# Patient Record
Sex: Female | Born: 1995 | Race: White | Hispanic: No | Smoking: Never smoker
Health system: Southern US, Community
[De-identification: ages and names within clinical notes are randomized; demographics above are authoritative.]

## PROBLEM LIST (undated history)

## (undated) HISTORY — PX: WISDOM TOOTH EXTRACTION: SHX21

---
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
## 2017-05-25 ENCOUNTER — Encounter (HOSPITAL_COMMUNITY): Payer: Self-pay | Admitting: Emergency Medicine

## 2017-05-25 ENCOUNTER — Other Ambulatory Visit: Payer: Self-pay

## 2017-05-25 ENCOUNTER — Emergency Department (HOSPITAL_COMMUNITY): Payer: BLUE CROSS/BLUE SHIELD

## 2017-05-25 ENCOUNTER — Observation Stay (HOSPITAL_COMMUNITY)
Admission: EM | Admit: 2017-05-25 | Discharge: 2017-05-26 | Disposition: A | Discharge status: Home / Self Care | Payer: BLUE CROSS/BLUE SHIELD | Attending: Pediatrics | Admitting: Pediatrics

## 2017-05-25 DIAGNOSIS — R109 Unspecified abdominal pain: Secondary | ICD-10-CM | POA: Diagnosis not present

## 2017-05-25 DIAGNOSIS — R748 Abnormal levels of other serum enzymes: Secondary | ICD-10-CM | POA: Diagnosis not present

## 2017-05-25 DIAGNOSIS — B2799 Infectious mononucleosis, unspecified with other complication: Secondary | ICD-10-CM

## 2017-05-25 DIAGNOSIS — R17 Unspecified jaundice: Secondary | ICD-10-CM | POA: Diagnosis not present

## 2017-05-25 DIAGNOSIS — B349 Viral infection, unspecified: Secondary | ICD-10-CM

## 2017-05-25 DIAGNOSIS — R509 Fever, unspecified: Secondary | ICD-10-CM | POA: Diagnosis present

## 2017-05-25 DIAGNOSIS — R112 Nausea with vomiting, unspecified: Secondary | ICD-10-CM | POA: Insufficient documentation

## 2017-05-25 DIAGNOSIS — B178 Other specified acute viral hepatitis: Secondary | ICD-10-CM | POA: Diagnosis not present

## 2017-05-25 LAB — CBC
HCT: 35.9 % — ABNORMAL LOW (ref 36.0–46.0)
Hemoglobin: 12.1 g/dL (ref 12.0–15.0)
MCH: 28.4 pg (ref 26.0–34.0)
MCHC: 33.7 g/dL (ref 30.0–36.0)
MCV: 84.3 fL (ref 78.0–100.0)
Platelets: 177 10*3/uL (ref 150–400)
RBC: 4.26 MIL/uL (ref 3.87–5.11)
RDW: 13.6 % (ref 11.5–15.5)
WBC: 8.3 10*3/uL (ref 4.0–10.5)

## 2017-05-25 LAB — COMPREHENSIVE METABOLIC PANEL
ALT: 207 U/L — ABNORMAL HIGH (ref 14–54)
AST: 135 U/L — ABNORMAL HIGH (ref 15–41)
Albumin: 3.5 g/dL (ref 3.5–5.0)
Alkaline Phosphatase: 233 U/L — ABNORMAL HIGH (ref 38–126)
Anion gap: 8 (ref 5–15)
BUN: <5 mg/dL — ABNORMAL LOW (ref 6–20)
CO2: 25 mmol/L (ref 22–32)
Calcium: 8.6 mg/dL — ABNORMAL LOW (ref 8.9–10.3)
Chloride: 104 mmol/L (ref 101–111)
Creatinine, Ser: 0.65 mg/dL (ref 0.44–1.00)
GFR calc Af Amer: >60 mL/min (ref >60)
GFR calc non Af Amer: >60 mL/min (ref >60)
Glucose, Bld: 93 mg/dL (ref 65–99)
Potassium: 3.5 mmol/L (ref 3.5–5.1)
Sodium: 137 mmol/L (ref 135–145)
Total Bilirubin: 3.6 mg/dL — ABNORMAL HIGH (ref 0.3–1.2)
Total Protein: 6.8 g/dL (ref 6.5–8.1)

## 2017-05-25 LAB — URINALYSIS, ROUTINE W REFLEX MICROSCOPIC
Glucose, UA: NEGATIVE mg/dL
Hgb urine dipstick: NEGATIVE
Ketones, ur: 5 mg/dL — AB
Leukocytes, UA: NEGATIVE
Nitrite: NEGATIVE
Protein, ur: NEGATIVE mg/dL
Specific Gravity, Urine: 1.011 (ref 1.005–1.030)
pH: 7 (ref 5.0–8.0)

## 2017-05-25 LAB — LIPASE, BLOOD: Lipase: 25 U/L (ref 11–51)

## 2017-05-25 LAB — POC URINE PREG, ED: Preg Test, Ur: NEGATIVE

## 2017-05-25 MED ORDER — ONDANSETRON HCL 4 MG/2ML IJ SOLN
4.0000 mg | Freq: Once | INTRAMUSCULAR | Status: AC
  Administered 2017-05-25: 4 mg via INTRAVENOUS
  Filled 2017-05-25: qty 2

## 2017-05-25 MED ORDER — ONDANSETRON HCL 4 MG/2ML IJ SOLN: 4.0000 mg | Freq: Four times a day (QID) | INTRAMUSCULAR | Status: DC | PRN

## 2017-05-25 MED ORDER — SODIUM CHLORIDE 0.9 % IV BOLUS (SEPSIS)
1000.0000 mL | Freq: Once | INTRAVENOUS | Status: AC
  Administered 2017-05-25: 1000 mL via INTRAVENOUS

## 2017-05-25 MED ORDER — DEXTROSE-NACL 5-0.9 % IV SOLN
INTRAVENOUS | Status: DC
  Administered 2017-05-26: via INTRAVENOUS

## 2017-05-25 MED ORDER — NORGESTIMATE-ETH ESTRADIOL 0.25-35 MG-MCG PO TABS: 1.0000 | ORAL_TABLET | Freq: Every day | ORAL | Status: DC

## 2017-05-25 NOTE — ED Triage Notes (Signed)
Patient presents with multiple complaints : fever , chills , emesis , generalized body aches , sore throat , malaise and headache onset last week , seen by her school MD /  PCP today with abnormal blood tests ( elevated bilirubin) results , advised to go to ER for possible admission . Consulted Corrie Mckusick. Mohr PA at triage on pt.'s condition .

## 2017-05-25 NOTE — ED Provider Notes (Signed)
Emergency Department Provider Note   I have reviewed the triage vital signs and the nursing notes.   HISTORY  Chief Complaint Fever; Chills; and Emesis   HPI Hailey Torres is a 21 y.o. female with no significant PMH presents to the emergency department for evaluation of flulike symptoms over the past 9 days with intermittent fevers along with nausea and vomiting which started today.  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  Her bilirubin came back elevated and she was referred to the emergency department for ultrasound.  Patient has not had fever today but states that her nausea and vomiting are new.   History reviewed. No pertinent past medical history.  There are no active problems to display for this patient.   Past Surgical History:  Procedure Laterality Date  . WISDOM TOOTH EXTRACTION      Current Outpatient Rx  . Order #: [id number] Class: Historical Med  . Order #: [id number] Class: Historical Med  . Order #: [id number] Class: Historical Med  . Order #: [id number] Class: Historical Med    Allergies Tape  No family history on file.  Social History Social History   Tobacco Use  . Smoking status: Never Smoker  . Smokeless tobacco: Never Used  Substance Use Topics  . Alcohol use: Yes    Frequency: Never  . Drug use: No    Review of Systems  Constitutional: Positive fever/chills Eyes: No visual changes. ENT: No sore throat. Cardiovascular: Denies chest pain. Respiratory: Denies shortness of breath. Gastrointestinal: No abdominal pain. Positive nausea and vomiting. No diarrhea. No constipation. Genitourinary: Negative for dysuria. Musculoskeletal: Negative for back pain. Skin: Negative for rash. Neurological: Negative for headaches, focal weakness or numbness.  10-point ROS otherwise negative.  ____________________________________________   PHYSICAL  EXAM:  VITAL SIGNS: ED Triage Vitals [05/25/17 2027]  Enc Vitals Group     BP 130/89     Pulse Rate (!) 116     Resp 18     Temp 99.4 F (37.4 C)     Temp Source Oral     SpO2 100 %    Constitutional: Alert and oriented. Well appearing and in no acute distress. Eyes: Conjunctivae are normal. Icteric sclera.  Head: Atraumatic. Nose: No congestion/rhinnorhea. Mouth/Throat: Mucous membranes are moist.  Neck: No stridor. Cardiovascular: Normal rate, regular rhythm. Good peripheral circulation. Grossly normal heart sounds.   Respiratory: Normal respiratory effort.  No retractions. Lungs CTAB. Gastrointestinal: Soft and nontender. No distention.  Musculoskeletal: No lower extremity tenderness nor edema. No gross deformities of extremities. Neurologic:  Normal speech and language. No gross focal neurologic deficits are appreciated.  Skin:  Skin is warm, dry and intact. No rash noted.  ____________________________________________   LABS (all labs ordered are listed, but only abnormal results are displayed)  Labs Reviewed  CBC - Abnormal; Notable for the following components:      Result Value   HCT 35.9 (*)    All other components within normal limits  COMPREHENSIVE METABOLIC PANEL - Abnormal; Notable for the following components:   BUN <5 (*)    Calcium 8.6 (*)    AST 135 (*)    ALT 207 (*)    Alkaline Phosphatase 233 (*)    Total Bilirubin 3.6 (*)    All other components within normal limits  URINALYSIS, ROUTINE W REFLEX MICROSCOPIC - Abnormal; Notable for the following components:   Color, Urine AMBER (*)  Bilirubin Urine SMALL (*)    Ketones, ur 5 (*)    All other components within normal limits  LIPASE, BLOOD  POC URINE PREG, ED   ____________________________________________  RADIOLOGY  [place] Abdomen Limited Ruq  Result Date: 05/25/2017 CLINICAL DATA:  Abdominal pain. EXAM: ULTRASOUND ABDOMEN LIMITED RIGHT UPPER QUADRANT COMPARISON:  None. FINDINGS:  Gallbladder: Contracted, patient was not NPO prior to the exam. Normal gallbladder wall thickness for degree of distension. No gallstones or pericholecystic inflammation. No sonographic Murphy sign noted by sonographer. Common bile duct: Diameter: 3 mm, normal. Liver: No focal lesion identified. Within normal limits in parenchymal echogenicity. Portal vein is patent on color Doppler imaging with normal direction of blood flow towards the liver. IMPRESSION: 1. Contracted gallbladder likely due to recent p.o. intake. No gallstones or sonographic findings of acute cholecystitis. 2. Normal sonographic appearance of the liver and biliary tree. Electronically Signed   By: Rubye OaksMelanie  Ehinger M.D.   On: 05/25/2017 21:54    ____________________________________________   PROCEDURES  Procedure(s) performed:   Procedures  None ____________________________________________   INITIAL IMPRESSION / ASSESSMENT AND PLAN / ED COURSE  Pertinent labs & imaging results that were available during my care of the patient were reviewed by me and considered in my medical decision making (see chart for details).  Patient presents to the ED with flu-like symptoms and elevated bilirubin and AST/ALT. US from triage is negative for bile duct dilation. No fever or evidence of cholangitis. Plan to speak with GI to discuss treatment and further testing.   10:14 PM Spoke with Dr. Levora AngelBrahmbhatt with GI who will consult in the AM. Recommends MRCP but notes he has seen cases recently of Mono causing similar symptoms. Will treat symptoms and as hospitalist for admission.   Discussed patient's case with Pediatric Service to request admission. Patient and family (if present) updated with plan. [facility]  I reviewed all nursing notes, vitals, pertinent old records, EKGs, labs, imaging (as available).  ____________________________________________  FINAL CLINICAL IMPRESSION(S) / ED DIAGNOSES  Final diagnoses:   Abdominal pain     MEDICATIONS GIVEN DURING THIS VISIT:  Medications  ondansetron (ZOFRAN) injection 4 mg (not administered)  sodium chloride 0.9 % bolus 1,000 mL (1,000 mLs Intravenous New Bag/Given 05/25/17 2209)    Note:  This document was prepared using Dragon voice recognition software and may include unintentional dictation errors.  Alona BeneJoshua Deeya Richeson, MD Emergency Medicine    Shivaun Bilello, Arlyss RepressJoshua G, MD 05/25/17 2227

## 2017-05-25 NOTE — ED Provider Notes (Signed)
MSE was initiated and I personally evaluated the patient and placed orders (if any) at  8:39 PM on May 25, 2017.  The patient appears stable so that the remainder of the MSE may be completed by another provider.  Called over from Triage due to complex patient   21 y.o. female, presents to the Emergency Department today from PCP due to elevated bilirubin levels. Unknown level as pt without paperwork or labs not accessible at this time on computer. Notes URI symptoms x several days. [facility]. Evaluated for Mono, which was negative. Pt went for follow up appointment today with PCP and was called with results of elevated bilirubin. Pt notes mild abdominal pain with PO intake earlier today. This caused her to have episode of emesis. Bilious without hematemesis. Notes low grade fever. No CP/SOB. No cough/congestion. No hx abdominal surgeries. Normal BMS. Pt otherwise stable  Physical Exam  Constitutional: She is oriented to person, place, and time and well-developed, well-nourished, and in no distress. No distress.  HENT:  Head: Normocephalic and atraumatic.  Eyes: Conjunctivae and EOM are normal. Pupils are equal, round, and reactive to light.  No scleral icterus   Neck: Normal range of motion. Neck supple.  Cardiovascular: Normal rate, regular rhythm and normal heart sounds.  Pulmonary/Chest: Breath sounds normal. She is in respiratory distress. She has no wheezes. She has no rales. She exhibits no tenderness.  Abdominal: Soft. Normal appearance and bowel sounds are normal. There is no hepatosplenomegaly. There is tenderness in the right upper quadrant. There is no rigidity, no rebound, no guarding, no CVA tenderness, no tenderness at McBurney's point and negative Murphy's sign.  Musculoskeletal: Normal range of motion.  Neurological: She is alert and oriented to person, place, and time.  Skin: Skin is warm and dry. She is not diaphoretic.  No evidence of  jaundice  Nursing note and vitals reviewed.  Pt with sudden onset abdominal pain emesis after PO intake. Suspect either cholecystitis, but doubt given exam. Potential for choledocholithiasis. There is low grade temp with abdominal discomfort. That with elevated bilirubin could be likely. Will order CBC/CMP/Lipase for further evaluation. RUQ US placed to eval CBD. If unremarkable, a viral hepatitis could be possible as well.  Pt stable at this time.        Audry PiliMohr, Briauna Gilmartin, PA-C 05/25/17 2110    Maia PlanLong, Joshua G, MD 05/25/17 2233

## 2017-05-25 NOTE — H&P (Signed)
Pediatric Teaching Program H&P [address]  [place], [place] [address] Phone: [phone] Fax: [phone]   Patient Details  Name: Hailey Torres MRN: [id number] DOB: 23-Feb-1996 Age: 21 y.o.          Gender: female   Chief Complaint  Elevated liver enzymes and Jaundice  History of the Present Illness  Hailey Torres is a previously healthy 21 year old female who presents with elevated liver enzymes, in the setting of 9 days of fatigue, intermittent fevers, decreased appetite, sore throat, and 1 day of nausea/vomiting  Patient states that she began feeling ill with symptoms of fatigue, decreased appetite, general body aches, and off and on fevers  approx 9 days ago. 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 The results of monospot test returned negative. However, patient continued with symptoms.   Patient eventually visited her PCP today who repeated blood work. At the time, liver enzymes were found to be abnormal with elevations as follows (AST - 125, ALT- 209, Total bili 1.7 (Direct of 1.6)). Because there was concern for infectious etiology, PCP tested for an EBV and CMV (results PENDING). Request was made by PCP for patient to be admitted for monitoring of liver function given acute abnormalities.  This afternoon, patient reports having NBNB emesis x 2 with some abdominal pain. No complaints of fever at this time. Was    Patient arrived to the ED where she was noted to be tachycardic to 116, with scleral icterus and mild jaundice but otherwise was afebrile with all other vitals within normal limits. CBC, CMP, lipase, UA, Upreg were drawn.  AlkPhos, AST, ALT were mildly elevated to 233, 135, and 207 respectively). UA was notably amber with small amount of bilirubin. All other labs were grossly normal. An Abdominal ultrasound was performed given concern for  stone or other obstruction. Results showed no stones, acute cholecystitis or obstruction to the biliary tree. Patient was started on IVF and given 1 dose of zofran for complaints of nausea.   Transferring to floor for continued monitoring of liver enzymes and for IVFs.    Review of Systems  Endorses fatigue, fever, sore throat, yellow eyes, decreased appetite, vomiting, lymphadenopathy  Denies chills, vision changes, rashes, parasthesias, itching, diarrhea, constipation, changes in mental status/behavior   Patient Active Problem List  Active Problems:   Jaundice   Past Birth, Medical & Surgical History  Past Birth Hx - Jaundiced as a neonate, but otherwise normal PMHx - None, though per patient, has been ill off and on since starting college  PSHx - Wisdom teeth removed  Developmental History  Normal (patient above and beyond per mother)  Diet History  Normal diet  Family History  PGF - gall bladder removed Mother - Hx of mono with splenomegaly, elevated liver enzymes, and jaundice  Social History  In college freshman in the dorm Social drinker - 1 to 2 beers every month Denies recreational drug use (cocaine, marijuana, heroine, PCP) Denies tobacco  Not sexually active, denies possibility of pregnancy Denies Depression, SI, SA  Primary Care Provider  [facility] Vaughan [facility]astaSummer, [facility]  Home Medications  Medication     Dose Birth Control 1 tab qD   Cetirizine 10mg  qD  Flonase 2 sprays both nares q D  Ibuprofen 200 -400mg  q 6hrs PRN      Allergies   Allergies  Allergen Reactions  . Tape Other (See Comments)  Band-aids ("Nexcare") made the patient's skin feel like it had been "burned"     Immunizations  UTD  Exam  BP 130/89 (BP Location: Right Arm)   Pulse (!) 116   Temp 99.4 F (37.4 C) (Oral)   Resp 18   LMP 05/19/2017   SpO2 100%   Weight:     No weight on file for this encounter.  General: Well nourished, well appearing female, alert and  oriented to person, place, time and situation, NAD HEENT: scleral icterus,  EOMI, Pupils equal and reactive to light, no erythema or exudate in oropharynx Neck: [place], full ROM Lymph nodes: Mildly tender anterior cervical and submandibular lymphadenopathy Chest: CTAB, no crackles, wheezing, rhonchi, normal work of breathing, no retractions or nasal flaring Heart: RRR, no m/g/r, normal S1 and S2, cap refil <3secs Abdomen: Soft, NT, ND, (+) Bowel sounds in 4 quadrants, no guarding, no splenomegaly, no hepatomegaly Genitalia: Deferred Extremities: Warm and well perfused, 2+ distal pulses in b/l upper and lower extremities Musculoskeletal: Full ROM of b/l upper and lower extremities, good strength  Neurological: CN 2-12 grossly intact,  Skin: Freckles, no jaundice, no rash, no   Selected Labs & Studies  U/S - normal, no eveidence of choledoicholitiasis Coags - (PTT, INR) to assess liver function F/U  EBV, CMV, from outpatient pediatricians office Hepatitis panel Hepatic Function in AM to trend AST, ALT, Bilirubin and  GI consult - potentially for the morning  Assessment  Hailey Torres is a previously healthy 21 year old female who presents with elevated liver enzymes with suspected mononucleosis given prolonged sxs of fatigue, intermittent fevers, decreased appetite, sore throat, and now nausea/vomiting. There is concern for infectious mononucleosis and EBV and CMV are currently pending. Also screening for other infectious causes of hepatitis with a Hep panel. Patient denies any concerning social behaviors such as drug use or sexual activity, however, will screen for HIV and obtaining a tylenol lvl to see if these are potential causes of patient's elevated liver enzymes.   Initially, there was concern for Sullivan LoneGilbert Syndrome, a heritable cause of indirect hyperbilirubinemia (given mother's similar hx of elevated liver enzymes and splenomegaly with mono infection in her teens as well as  patient's hx of jaundice as a neonate) Encouraged that there is no increased indirect bili on initial labs, a feature of Gilbert syndrome. Will monitor and trend Direct and indirect bilirubin. Monitoring coags and repeating Liver enzymes to assess patient's liver function.   Plan  Elevated liver enzymes/Jaundice - F/U EBV and CMV - Coags (PTT, INR) now - HIV now - Tylenol level now - Hepatitis Panel now - Hepatic Function Panel in AM - Peds GI consult  FEN/GI - NPO  - D5NS mIVF - Zofran prn for n/v/ abdominal pain  Endo - Continue birth control  DISPO - Pending improvement in clincal status, improving liver enzymes,     Dekisha Mesmer 05/25/2017, 10:29 PM

## 2017-05-26 ENCOUNTER — Other Ambulatory Visit: Payer: Self-pay

## 2017-05-26 ENCOUNTER — Encounter (HOSPITAL_COMMUNITY): Payer: Self-pay

## 2017-05-26 DIAGNOSIS — R748 Abnormal levels of other serum enzymes: Secondary | ICD-10-CM

## 2017-05-26 DIAGNOSIS — B349 Viral infection, unspecified: Secondary | ICD-10-CM

## 2017-05-26 LAB — PROTIME-INR
INR: 1.06
Prothrombin Time: 13.7 seconds (ref 11.4–15.2)

## 2017-05-26 LAB — APTT: aPTT: 29 seconds (ref 24–36)

## 2017-05-26 LAB — HEPATIC FUNCTION PANEL
ALT: 180 U/L — ABNORMAL HIGH (ref 14–54)
AST: 118 U/L — ABNORMAL HIGH (ref 15–41)
Albumin: 3 g/dL — ABNORMAL LOW (ref 3.5–5.0)
Alkaline Phosphatase: 205 U/L — ABNORMAL HIGH (ref 38–126)
Bilirubin, Direct: 1.6 mg/dL — ABNORMAL HIGH (ref 0.1–0.5)
Indirect Bilirubin: 1.3 mg/dL — ABNORMAL HIGH (ref 0.3–0.9)
Total Bilirubin: 2.9 mg/dL — ABNORMAL HIGH (ref 0.3–1.2)
Total Protein: 5.9 g/dL — ABNORMAL LOW (ref 6.5–8.1)

## 2017-05-26 LAB — ACETAMINOPHEN LEVEL: Acetaminophen (Tylenol), Serum: <10 ug/mL — ABNORMAL LOW (ref 10–30)

## 2017-05-26 LAB — HIV ANTIBODY (ROUTINE TESTING W REFLEX): HIV Screen 4th Generation wRfx: NONREACTIVE

## 2017-05-26 LAB — MONONUCLEOSIS SCREEN: Mono Screen: NEGATIVE

## 2017-05-26 MED ORDER — KETOROLAC TROMETHAMINE 15 MG/ML IJ SOLN
15.0000 mg | Freq: Once | INTRAMUSCULAR | Status: AC
  Administered 2017-05-26: 15 mg via INTRAVENOUS
  Filled 2017-05-26: qty 1

## 2017-05-26 MED ORDER — ONDANSETRON HCL 4 MG PO TABS: 4.0000 mg | ORAL_TABLET | Freq: Three times a day (TID) | ORAL | 0 refills | Status: AC | PRN

## 2017-05-26 NOTE — Consult Note (Signed)
Referring Provider:  EDP [facility] Primary Gastroenterologist: Gentry FitzUnassigned  Reason for Consultation:   Abdominal pain/ elevated LFTs  HPI: Hailey Torres is a 21 y.o. female was advised to come to the ED for possible admission by primary care physician because of elevated LFTs.   Patient seen and examined at bedside. Patient started noticing fatigue and weakness around 1 week ago. She also noted dark urine starting 4 days ago followed by urologist discoloration of her eye. She was followed by numerous to the clinic. Mononucleosis screen was negative. Seen by PCP  yesterday . Blood work  showed elevated LFTs and was advised to come to the ED for further evaluation. Ultrasound was done yesterday which was normal including CBD of 3 mm.  Descending and abdominal pain. Episodes of nausea and vomiting yesterday which is resolved now. Denied diarrhea. Occasional constipation. Denied blood in the stool or black stool. Denied sore throat. No dysphagia or odynophagia  No known family history of colon cancer   History reviewed. No pertinent past medical history.  Past Surgical History:  Procedure Laterality Date  . WISDOM TOOTH EXTRACTION      Prior to Admission medications   Medication Sig Start Date End Date Taking? Authorizing Provider  cetirizine (ZYRTEC) 10 MG tablet Take 10 mg by mouth daily as needed for allergies or rhinitis.   Yes [provider]  fluticasone (FLONASE) 50 MCG/ACT nasal spray Place 2 sprays into both nostrils daily as needed for allergies or rhinitis.   Yes [provider]  ibuprofen (ADVIL,MOTRIN) 200 MG tablet Take 200-400 mg by mouth every 6 (six) hours as needed (for pain or headaches).   Yes [provider]  norgestimate-ethinyl estradiol (ESTARYLLA) 0.25-35 MG-MCG tablet Take 1 tablet by mouth daily.   Yes [provider]    Scheduled Meds: . norgestimate-ethinyl estradiol  1 tablet  Oral Daily   Continuous Infusions: . dextrose 5 % and 0.9% NaCl 100 mL/hr at 05/26/17 0015   PRN Meds:.ondansetron (ZOFRAN) IV  Allergies as of 05/25/2017 - Review Complete 05/25/2017  Allergen Reaction Noted  . Tape Other (See Comments) 05/25/2017    No family history on file.  Social History   Socioeconomic History  . Marital status: Single    Spouse name: Not on file  . Number of children: Not on file  . Years of education: Not on file  . Highest education level: Not on file  Social Needs  . Financial resource strain: Not on file  . Food insecurity - worry: Not on file  . Food insecurity - inability: Not on file  . Transportation needs - medical: Not on file  . Transportation needs - non-medical: Not on file  Occupational History  . Not on file  Tobacco Use  . Smoking status: Never Smoker  . Smokeless tobacco: Never Used  Substance and Sexual Activity  . Alcohol use: Yes    Frequency: Never  . Drug use: No  . Sexual activity: No    Birth control/protection: Pill  Other Topics Concern  . Not on file  Social History Narrative  . Not on file    Review of Systems: Review of Systems  Constitutional: Positive for chills, fever and malaise/fatigue.  HENT: Negative for hearing loss and tinnitus.   Eyes: Negative for blurred vision and double vision.  Respiratory: Negative for cough, hemoptysis and shortness of breath.   Cardiovascular: Negative for chest pain and palpitations.  Gastrointestinal: Positive for nausea and  vomiting. Negative for abdominal pain, blood in stool, heartburn and melena.  Genitourinary: Negative for dysuria and urgency.  Musculoskeletal: Positive for myalgias. Negative for falls.  Skin: Negative for itching and rash.  Neurological: Positive for weakness. Negative for focal weakness and seizures.  Endo/Heme/Allergies: Does not bruise/bleed easily.  Psychiatric/Behavioral: Negative for hallucinations and suicidal ideas.    Physical  Exam: Vital signs: Vitals:   05/26/17 0441 05/26/17 0822  BP:  114/74  Pulse: 90 92  Resp: 18 16  Temp: 98.7 F (37.1 C) 99.1 F (37.3 C)  SpO2: 97% 98%     Physical Exam  Constitutional: She is oriented to person, place, and time. She appears well-developed and well-nourished. No distress.  HENT:  Head: Normocephalic and atraumatic.  Mouth/Throat: Oropharynx is clear and moist. No oropharyngeal exudate.  Eyes: EOM are normal. Right eye exhibits no discharge. Left eye exhibits no discharge. Scleral icterus is present.  Neck: Normal range of motion. Neck supple. No thyromegaly present.  Cardiovascular: Normal rate, regular rhythm and normal heart sounds.  Pulmonary/Chest: Effort normal and breath sounds normal. No respiratory distress.  Abdominal: Soft. Bowel sounds are normal. She exhibits no distension. There is no tenderness. There is no rebound and no guarding.  Musculoskeletal: Normal range of motion. She exhibits no edema.  Neurological: She is alert and oriented to person, place, and time.  Skin: Skin is warm. No erythema.  Psychiatric: She has a normal mood and affect. Judgment and thought content normal.  Vitals reviewed.   GI:  Lab Results: Recent Labs    05/25/17 2036  WBC 8.3  HGB 12.1  HCT 35.9*  PLT 177   BMET Recent Labs    05/25/17 2036  NA 137  K 3.5  CL 104  CO2 25  GLUCOSE 93  BUN <5*  CREATININE 0.65  CALCIUM 8.6*   LFT Recent Labs    05/26/17 0133  PROT 5.9*  ALBUMIN 3.0*  AST 118*  ALT 180*  ALKPHOS 205*  BILITOT 2.9*  BILIDIR 1.6*  IBILI 1.3*   PT/INR Recent Labs    05/26/17 0133  LABPROT 13.7  INR 1.06     Studies/Results: [place] Abdomen Limited Ruq  Result Date: 05/25/2017 CLINICAL DATA:  Abdominal pain. EXAM: ULTRASOUND ABDOMEN LIMITED RIGHT UPPER QUADRANT COMPARISON:  None. FINDINGS: Gallbladder: Contracted, patient was not NPO prior to the exam. Normal gallbladder wall thickness for degree of distension. No  gallstones or pericholecystic inflammation. No sonographic Murphy sign noted by sonographer. Common bile duct: Diameter: 3 mm, normal. Liver: No focal lesion identified. Within normal limits in parenchymal echogenicity. Portal vein is patent on color Doppler imaging with normal direction of blood flow towards the liver. IMPRESSION: 1. Contracted gallbladder likely due to recent p.o. intake. No gallstones or sonographic findings of acute cholecystitis. 2. Normal sonographic appearance of the liver and biliary tree. Electronically Signed   By: Rubye OaksMelanie  Ehinger M.D.   On: 05/25/2017 21:54    Impression/Plan: - Elevated LFTs. Mixed pattern. Differential diagnosis includes infectious mononucleosis versus hepatitis.  Recommendations ------------------------ - Patient's LFTs are improving. Her initial mono mononucleosis screen was negative. I will get EBV antibody to  Viral capsid  Antigen - Ultrasound showed no acute changes. CBD of 3 mm . Normal CBC. Normal lipase. - Hepatitis panel pending -Start patient on soft diet. If able to tolerate diet, okay to discharge from GI standpoint. - Follow-up in GI clinic in 1-2 weeks  - Avoid hepatotoxic drugs for now.  - GI will  sign off. Call us back if needed    LOS: 0 days   Kathi Der  MD, FACP 05/26/2017, 8:50 AM  Contact #  [phone]

## 2017-05-26 NOTE — Progress Notes (Signed)
Patient transferred to 6M14. Report given to Conrad BurlingtonNatro Dove RN. Patient cell phone and shoes given to patient.

## 2017-05-26 NOTE — ED Notes (Signed)
No addl blood draw,  Pt enroute to inpatient floor. 

## 2017-05-26 NOTE — Progress Notes (Signed)
Patient   received from ED accompanied by parents and ER nurse. Patient oriented to room. Call bell and personal items within reach of patient. Plan of care reviewed with patient and patient verbalizes understanding .

## 2017-05-26 NOTE — Discharge Instructions (Signed)
I am glad that you are feeling better. You were admitted for increased liver enzymes. This is presumed to have been caused by a virus, lost likely mono or another related virus. Your lab abnormalities improved on this mornings labs. Please follow up with your pcp in around 1 week for a recheck.

## 2017-05-26 NOTE — Progress Notes (Signed)
Patient requesting something for headache. Text paged Dr Esmond HarpsSlater Robert. Awaiting  response

## 2017-05-26 NOTE — Discharge Summary (Signed)
Pediatric Teaching Program Discharge Summary [address]  [place], [place] [address] Phone: [phone] Fax: [phone]   Patient Details  Name: JAQUETA Torres MRN: [id number] DOB: December 30, 1995 Age: 21 y.o.          Gender: female  Admission/Discharge Information   Admit Date:  05/25/2017  Discharge Date: 05/26/2017  Length of Stay: 0   Reason(s) for Hospitalization  Elevated liver enzymes  Problem List   Active Problems:   Elevated liver enzymes   Viral illness  Final Diagnoses  Viral illness with LFT elevation   Brief Hospital Course (including significant findings and pertinent lab/radiology studies)  Hailey Torres is a previously healthy 21 year old female who was admitted for elevated liver enzymes in the setting of 9 days of fatigue, intermittent fevers, decreased appetite, sore throat, and 1 day of nausea/vomiting.  She had been feeling ill with sxs of fatigue, decreased appetite, general body aches, and off and on fevers approx 9 days ago. 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 The results of monospot test returned negative. However, patient continued with symptoms. She denied any sore throat but did have enlarged lymph nods.   Patient eventually visited her PCP today who repeated blood work. At the time, liver enzymes were found to be abnormal with elevations as follows (AST - 125, ALT- 209, Total bili 1.7 (Direct of 1.6)). Because there was concern for infectious etiology, PCP tested for an EBV and CMV (results PENDING). Request was made by PCP for patient to be admitted for monitoring of liver function given acute abnormalities.   Patient arrived to the ED where she was noted to be tachycardic to 116 otherwise was afebrile with all other vitals within normal limits. CBC, CMP, lipase, UA, Upreg were drawn.  AlkPhos, AST, ALT were  mildly elevated to 233, 135, and 207 respectively. INR was 1.06 and patient displayed no confusion or asterixis. UA was notably amber with small amount of bilirubin. All other labs were grossly normal. An Abdominal ultrasound was performed given concern for stone or other obstruction. Results showed no stones, acute cholecystitis or obstruction to the biliary tree. Patient was started on IVF and given 1 dose of zofran for complaints of nausea.   Medical Decision Making  The patient had very mild increase in her LFTs and bilirubin in the setting of several days of viral-type symptoms. She had no RUQ pain and her abdominal ultrasound was negative. On the morning after admission, her transaminitis was improving and she tolerated po without any nausea or vomiting. She was seen by gastroenterology, who recommended discharge and follow up with pediatrician in 1-2 weeks for repeat LFTs.   The most likely cause of the mild transaminitis is a viral infection (for instance, EBV, CMV, or another viral syndrome). Her tylenol level was undetectable, she does not drink alcohol, upreg was negative, HIV was negative. Monospot was again negative, but EBV and CMV serologies were sent by the PCP and pending at discharge. Hepatitis panel was also pending at discharge. We encouraged rest, hydration, and supportive care for Rosabella's symptoms and prescribed zofran for any nausea.   Procedures/Operations  RUQ ultrasound  Consultants  Gastroenterology  Focused Discharge Exam  BP 107/72 (BP Location: Right Arm)   Pulse 100   Temp 99 F (37.2 C) (Temporal)   Resp 18   Ht 5' 6.5" (1.689 m)   Wt 62.1 kg (137  lb)   LMP 05/19/2017   SpO2 100%   BMI 21.78 kg/m  General: alert, well-nourished, sitting up in bed in NAD. HEENT: mucous membranes moist, oropharynx is pink, pharynx without exudate or erythema. Shotty, sub-centimeter cervical and submandibular LAD. No notable scleral icterus.  Respiratory: Appears  comfortable with no increased work of breathing. Good air movement throughout without wheezing or crackles. Heart: RRR, normal S1/S2. No murmurs appreciated on my exam. Extremities are warm and well perfused with strong, equal pulses in bilateral extremities.  Abdomen: NT, ND. No RUQ tenderness. Liver edge percussed just above the costal margin, liver and spleen do not feel enlarged. Skin: warm and dry without rashes MSK: normal bulk and tone throughout without any obvious deformity Neuro: alert and oriented. CNs are grossly intact. No focal abnormalities noted.  Discharge Instructions   Discharge Weight: 62.1 kg (137 lb)   Discharge Condition: Improved  Discharge Diet: Resume diet  Discharge Activity: Ad lib   Discharge Medication List   Allergies as of 05/26/2017      Reactions   Tape Other (See Comments)   Band-aids ("Nexcare") made the patient's skin feel like it had been "burned"      Medication List    TAKE these medications   cetirizine 10 MG tablet Commonly known as:  ZYRTEC Take 10 mg by mouth daily as needed for allergies or rhinitis.   ESTARYLLA 0.25-35 MG-MCG tablet Generic drug:  norgestimate-ethinyl estradiol Take 1 tablet by mouth daily.   fluticasone 50 MCG/ACT nasal spray Commonly known as:  FLONASE Place 2 sprays into both nostrils daily as needed for allergies or rhinitis.   ibuprofen 200 MG tablet Commonly known as:  ADVIL,MOTRIN Take 200-400 mg by mouth every 6 (six) hours as needed (for pain or headaches).   ondansetron 4 MG tablet Commonly known as:  ZOFRAN Take 1 tablet (4 mg total) by mouth every 8 (eight) hours as needed for nausea or vomiting.      Immunizations Given (date): none  Follow-up Issues and Recommendations  Follow up in 1-2 weeks with pediatrician. Repeat CMP at that time to confirm normalization of LFTs and bilirubin. If abnormal, can follow up with adult Gastroenterology (Dr. Levora Angel), as he saw her during hospitalization.  Phone number is included below.  Pending Results   Unresulted Labs (From admission, onward)   Start     Ordered   05/26/17 0928  EBV ab to viral capsid ag pnl, IgG+IgM  Once,   R     05/26/17 0927   05/25/17 2304  Hepatitis panel, acute  Add-on,   R     05/25/17 2303      Future Appointments   Follow-up Information    Brahmbhatt, Parag, MD. Schedule an appointment as soon as possible for a visit in 10 day(s).   Specialty:  Gastroenterology Contact information: [address] [place] [address] [phone]           Alexis Goodell 05/26/2017, 3:08 PM

## 2017-05-26 NOTE — Progress Notes (Addendum)
MD Brahmbhatt told RN that patient could have soft diet and would go home when she tolarates. Would tell Peds team and patient.   She tolerated her late breakfast well, no complained of pain, nausea. Noticed MD Primitivo GauzeFletcher she tolerated meal.  Discharge instruction given. Mom asked if she needed to go to see her PCP. Spoke with MD Primitivo GauzeFletcher that going to PCP in 7 days with repeated blood work. If abnormal, then go to her GI. Mom understood it. Offered her for wheelchair but she refused it.

## 2017-05-26 NOTE — Progress Notes (Signed)
Pt was admitted for elevated liver enzymes, Pt's skin is slightly jaundiced.Pt has been told to call for assistance when going to restroom b/c of generalized weakness. Pt has a lt forearm IV infusing at 100 ml/ hr. Pt has voided once during this shift and is NPO.

## 2017-05-27 LAB — HEPATITIS PANEL, ACUTE
HCV Ab: <0.1 s/co ratio (ref 0.0–0.9)
Hep A IgM: NEGATIVE
Hep B C IgM: NEGATIVE
Hepatitis B Surface Ag: NEGATIVE

## 2017-05-28 ENCOUNTER — Emergency Department (HOSPITAL_BASED_OUTPATIENT_CLINIC_OR_DEPARTMENT_OTHER)
Admission: EM | Admit: 2017-05-28 | Discharge: 2017-05-28 | Disposition: A | Discharge status: Home / Self Care | Payer: BLUE CROSS/BLUE SHIELD | Attending: Emergency Medicine | Admitting: Emergency Medicine

## 2017-05-28 ENCOUNTER — Other Ambulatory Visit: Payer: Self-pay

## 2017-05-28 ENCOUNTER — Encounter (HOSPITAL_BASED_OUTPATIENT_CLINIC_OR_DEPARTMENT_OTHER): Payer: Self-pay | Admitting: Emergency Medicine

## 2017-05-28 DIAGNOSIS — E860 Dehydration: Secondary | ICD-10-CM | POA: Insufficient documentation

## 2017-05-28 DIAGNOSIS — R740 Nonspecific elevation of levels of transaminase and lactic acid dehydrogenase [LDH]: Secondary | ICD-10-CM | POA: Insufficient documentation

## 2017-05-28 DIAGNOSIS — B2790 Infectious mononucleosis, unspecified without complication: Secondary | ICD-10-CM | POA: Insufficient documentation

## 2017-05-28 DIAGNOSIS — Z79899 Other long term (current) drug therapy: Secondary | ICD-10-CM | POA: Insufficient documentation

## 2017-05-28 DIAGNOSIS — R112 Nausea with vomiting, unspecified: Secondary | ICD-10-CM | POA: Diagnosis present

## 2017-05-28 DIAGNOSIS — R7401 Elevation of levels of liver transaminase levels: Secondary | ICD-10-CM

## 2017-05-28 LAB — COMPREHENSIVE METABOLIC PANEL
ALT: 235 U/L — ABNORMAL HIGH (ref 14–54)
AST: 162 U/L — ABNORMAL HIGH (ref 15–41)
Albumin: 3.2 g/dL — ABNORMAL LOW (ref 3.5–5.0)
Alkaline Phosphatase: 255 U/L — ABNORMAL HIGH (ref 38–126)
Anion gap: 9 (ref 5–15)
BUN: 10 mg/dL (ref 6–20)
CO2: 24 mmol/L (ref 22–32)
Calcium: 8.3 mg/dL — ABNORMAL LOW (ref 8.9–10.3)
Chloride: 101 mmol/L (ref 101–111)
Creatinine, Ser: 0.67 mg/dL (ref 0.44–1.00)
GFR calc Af Amer: >60 mL/min (ref >60)
GFR calc non Af Amer: >60 mL/min (ref >60)
Glucose, Bld: 82 mg/dL (ref 65–99)
Potassium: 3.6 mmol/L (ref 3.5–5.1)
Sodium: 134 mmol/L — ABNORMAL LOW (ref 135–145)
Total Bilirubin: 2.7 mg/dL — ABNORMAL HIGH (ref 0.3–1.2)
Total Protein: 6.6 g/dL (ref 6.5–8.1)

## 2017-05-28 LAB — CBC WITH DIFFERENTIAL/PLATELET
Basophils Absolute: 0 10*3/uL (ref 0.0–0.1)
Basophils Relative: 0 %
Eosinophils Absolute: 0.1 10*3/uL (ref 0.0–0.7)
Eosinophils Relative: 1 %
HCT: 33.7 % — ABNORMAL LOW (ref 36.0–46.0)
Hemoglobin: 11.3 g/dL — ABNORMAL LOW (ref 12.0–15.0)
Lymphocytes Relative: 62 %
Lymphs Abs: 5.4 10*3/uL — ABNORMAL HIGH (ref 0.7–4.0)
MCH: 28.7 pg (ref 26.0–34.0)
MCHC: 33.5 g/dL (ref 30.0–36.0)
MCV: 85.5 fL (ref 78.0–100.0)
Monocytes Absolute: 1.4 10*3/uL — ABNORMAL HIGH (ref 0.1–1.0)
Monocytes Relative: 16 %
Neutro Abs: 1.8 10*3/uL (ref 1.7–7.7)
Neutrophils Relative %: 21 %
Platelets: 185 10*3/uL (ref 150–400)
RBC: 3.94 MIL/uL (ref 3.87–5.11)
RDW: 13.9 % (ref 11.5–15.5)
WBC: 8.7 10*3/uL (ref 4.0–10.5)

## 2017-05-28 LAB — LIPASE, BLOOD: Lipase: 22 U/L (ref 11–51)

## 2017-05-28 MED ORDER — FAMOTIDINE IN NACL 20-0.9 MG/50ML-% IV SOLN
20.0000 mg | Freq: Once | INTRAVENOUS | Status: AC
  Administered 2017-05-28: 20 mg via INTRAVENOUS
  Filled 2017-05-28: qty 50

## 2017-05-28 MED ORDER — SODIUM CHLORIDE 0.9 % IV BOLUS (SEPSIS)
1000.0000 mL | Freq: Once | INTRAVENOUS | Status: AC
  Administered 2017-05-28: 1000 mL via INTRAVENOUS

## 2017-05-28 MED ORDER — FAMOTIDINE 20 MG PO TABS: 20.0000 mg | ORAL_TABLET | Freq: Two times a day (BID) | ORAL | 0 refills | Status: AC

## 2017-05-28 MED ORDER — SODIUM CHLORIDE 0.9 % IV BOLUS (SEPSIS)
2000.0000 mL | Freq: Once | INTRAVENOUS | Status: AC
  Administered 2017-05-28: 2000 mL via INTRAVENOUS

## 2017-05-28 MED ORDER — METOCLOPRAMIDE HCL 5 MG/ML IJ SOLN
10.0000 mg | Freq: Once | INTRAMUSCULAR | Status: AC
  Administered 2017-05-28: 10 mg via INTRAVENOUS
  Filled 2017-05-28: qty 2

## 2017-05-28 MED ORDER — ONDANSETRON HCL 4 MG/2ML IJ SOLN
4.0000 mg | Freq: Once | INTRAMUSCULAR | Status: AC
  Administered 2017-05-28: 4 mg via INTRAVENOUS
  Filled 2017-05-28: qty 2

## 2017-05-28 MED ORDER — METOCLOPRAMIDE HCL 10 MG PO TABS: 10.0000 mg | ORAL_TABLET | Freq: Four times a day (QID) | ORAL | 0 refills | Status: AC

## 2017-05-28 NOTE — ED Provider Notes (Signed)
[facility] EMERGENCY DEPARTMENT Provider Note   CSN: [id number] Arrival date & time: 05/28/17  1109     History   Chief Complaint Chief Complaint  Patient presents with  . Emesis    HPI Hailey Torres is a 21 y.o. female.  Patient is a 21 year old female with no significant past medical history presenting today with persistent vomiting and concern for dehydration.  Patient states over the last 3 weeks she has had a steady decline in her health.  She has been following with her doctor and was found to have mono and viral hepatitis.  Patient was hospitalized on 05/25/2017 due to elevated bilirubin, vomiting and general dehydration.  Mom states she was able to tolerate them often on Thursday morning and was sent home.  However since being at home she has been able to eat very little and continues to vomit.  She has not had fever in the last 5 days and denies any localized abdominal pain.  Patient has had an ultrasound of her liver and spleen without acute findings.  Patient has been trying to take Zofran at home but this is not seem to be stopping the vomiting.  After speaking with her pediatrician today they recommended they come here for further evaluation.  Other than Zofran patient is not taking any medications.  She cannot recall when her last bowel movement was but does not feel the need to have to go and last menstrual period was within the last month without urinary symptoms.  She is completely vaccinated and has not had any recent travel outside the [place].   The history is provided by the patient and a parent.  Emesis   This is a recurrent problem.    History reviewed. No pertinent past medical history.  Patient Active Problem List   Diagnosis Date Noted  . Elevated liver enzymes 05/26/2017  . Viral illness 05/26/2017    Past Surgical History:  Procedure Laterality Date  . WISDOM TOOTH EXTRACTION      OB History    No data available        Home Medications    Prior to Admission medications   Medication Sig Start Date End Date Taking? Authorizing Provider  cetirizine (ZYRTEC) 10 MG tablet Take 10 mg by mouth daily as needed for allergies or rhinitis.    [provider]  fluticasone (FLONASE) 50 MCG/ACT nasal spray Place 2 sprays into both nostrils daily as needed for allergies or rhinitis.    [provider]  ibuprofen (ADVIL,MOTRIN) 200 MG tablet Take 200-400 mg by mouth every 6 (six) hours as needed (for pain or headaches).    [provider]  norgestimate-ethinyl estradiol (ESTARYLLA) 0.25-35 MG-MCG tablet Take 1 tablet by mouth daily.    [provider]  ondansetron (ZOFRAN) 4 MG tablet Take 1 tablet (4 mg total) by mouth every 8 (eight) hours as needed for nausea or vomiting. 05/26/17   Myrene Buddy, MD    Family History History reviewed. No pertinent family history.  Social History Social History   Tobacco Use  . Smoking status: Never Smoker  . Smokeless tobacco: Never Used  Substance Use Topics  . Alcohol use: Yes    Frequency: Never  . Drug use: No     Allergies   Tape   Review of Systems Review of Systems  Gastrointestinal: Positive for vomiting.  All other systems reviewed and are negative.    Physical Exam Updated Vital Signs BP 111/74 (BP  Location: Left Arm)   Pulse 92   Temp 98.6 F (37 C) (Oral)   Resp 18   Ht 5' 6.5" (1.689 m)   Wt 62.1 kg (137 lb)   LMP 05/19/2017   SpO2 98%   BMI 21.78 kg/m   Physical Exam  Constitutional: She is oriented to person, place, and time. She appears well-developed and well-nourished. No distress.  HENT:  Head: Normocephalic and atraumatic.  Mouth/Throat: Mucous membranes are dry. Oropharyngeal exudate, posterior oropharyngeal edema and posterior oropharyngeal erythema present.  Eyes: Conjunctivae and EOM are normal. Pupils are equal, round, and reactive to light. Scleral icterus is present.  Neck: Normal  range of motion. Neck supple.  Cardiovascular: Normal rate, regular rhythm and intact distal pulses.  No murmur heard. Pulmonary/Chest: Effort normal and breath sounds normal. No respiratory distress. She has no wheezes. She has no rales.  Abdominal: Soft. She exhibits no distension. There is no tenderness. There is no rebound and no guarding.  Musculoskeletal: Normal range of motion. She exhibits no edema or tenderness.  Neurological: She is alert and oriented to person, place, and time.  Skin: Skin is warm and dry. No rash noted. No erythema.  Psychiatric: She has a normal mood and affect. Her behavior is normal.  Nursing note and vitals reviewed.    ED Treatments / Results  Labs (all labs ordered are listed, but only abnormal results are displayed) Labs Reviewed  CBC WITH DIFFERENTIAL/PLATELET - Abnormal; Notable for the following components:      Result Value   Hemoglobin 11.3 (*)    HCT 33.7 (*)    Lymphs Abs 5.4 (*)    Monocytes Absolute 1.4 (*)    All other components within normal limits  COMPREHENSIVE METABOLIC PANEL - Abnormal; Notable for the following components:   Sodium 134 (*)    Calcium 8.3 (*)    Albumin 3.2 (*)    AST 162 (*)    ALT 235 (*)    Alkaline Phosphatase 255 (*)    Total Bilirubin 2.7 (*)    All other components within normal limits  LIPASE, BLOOD    EKG  EKG Interpretation None       Radiology No results found.  Procedures Procedures (including critical care time)  Medications Ordered in ED Medications  sodium chloride 0.9 % bolus 2,000 mL (not administered)  metoCLOPramide (REGLAN) injection 10 mg (not administered)     Initial Impression / Assessment and Plan / ED Course  I have reviewed the triage vital signs and the nursing notes.  Pertinent labs & imaging results that were available during my care of the patient were reviewed by me and considered in my medical decision making (see chart for details).     Patient with  recent diagnosis of mono and viral hepatitis who was hospitalized earlier this week returning due to persistent vomiting.  She is not toxic appearing but does appear dehydrated.  She has no focal abdominal pain and denies any urinary or bowel symptoms.  She states that she is typically vomiting 2-3 times a day but basically just will not eat anything.  She is having a mild headache but denies any recent fevers. Repeat CBC, CMP and lipase pending.  We will give patient IV fluids and antiemetics.  1:48 PM Patient's labs are relatively unchanged except for mild elevation of AST and ALT above what they were on initial check.  Total bilirubin has improved.  Patient given 2 L of IV fluid.  Given  Reglan and stated initially she felt a lot better.  She was given ginger ale and crackers and after eating those she felt slightly nauseated and was given Zofran.  3:22 PM Patient has had 3 L of fluid, some of her symptoms that she complains of sound like reflux so she was given Pepcid.  She has been able to eat crackers and drink ginger ale here and has had no further vomiting.  Charge patient with Pepcid and Reglan.  She has close follow-up with her pediatrician.  Cautioned if her symptoms return and she is unable to hold down fluids she would need to return for admission and IV fluids. Final Clinical Impressions(s) / ED Diagnoses   Final diagnoses:  Dehydration  Intractable vomiting with nausea, unspecified vomiting type  Transaminitis  Mononucleosis    ED Discharge Orders        Ordered    famotidine (PEPCID) 20 MG tablet  2 times daily     05/28/17 1520    metoCLOPramide (REGLAN) 10 MG tablet  Every 6 hours     05/28/17 1520       Gwyneth SproutPlunkett, Boots Mcglown, MD 05/28/17 1523

## 2017-05-28 NOTE — ED Notes (Signed)
D/c home with family. Rx x 2 given for reglan and pepcid. School note given

## 2017-05-28 NOTE — ED Triage Notes (Signed)
Patient was recently seen at [facility] for Mono and viral hepatitis. The patient reports that she was sent home. Patient reports that she started to vomit Wed night - emesis had cleared while at the hospital and then started back Thursday night after being released  - The patient reports that she started to vomit bile last night

## 2017-05-28 NOTE — ED Notes (Signed)
ED Provider at bedside. 

## 2017-05-29 LAB — EBV AB TO VIRAL CAPSID AG PNL, IGG+IGM
EBV VCA IgG: 47.7 U/mL — ABNORMAL HIGH (ref 0.0–17.9)
EBV VCA IgM: >160 U/mL — ABNORMAL HIGH (ref 0.0–35.9)

## 2017-06-02 DIAGNOSIS — B2799 Infectious mononucleosis, unspecified with other complication: Secondary | ICD-10-CM

## 2017-06-02 DIAGNOSIS — B178 Other specified acute viral hepatitis: Secondary | ICD-10-CM

## 2019-10-20 IMAGING — US US ABDOMEN LIMITED
1 series · 14 of 25 positions shown · non-contrast
Comparison: None.

CLINICAL DATA: Abdominal pain.

EXAM:
ULTRASOUND ABDOMEN LIMITED RIGHT UPPER QUADRANT

[Series 1: us abdomen limited · 0.22mm/px · 14 of 26 slices shown]
[im 1/26]
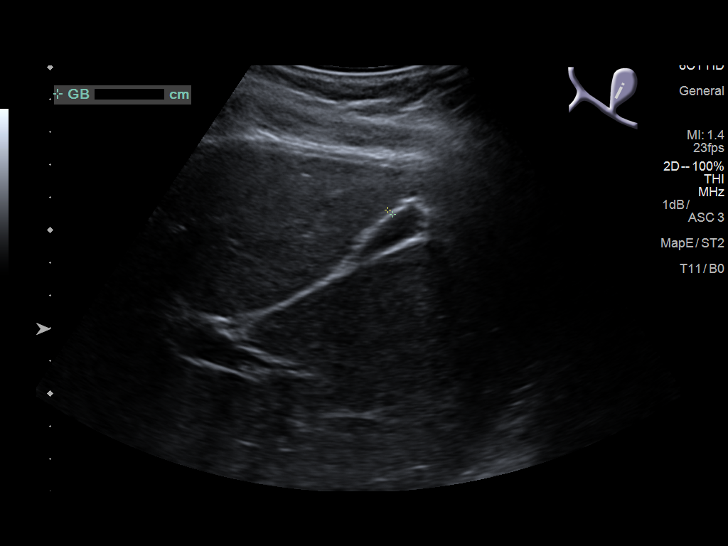
[im 3/26]
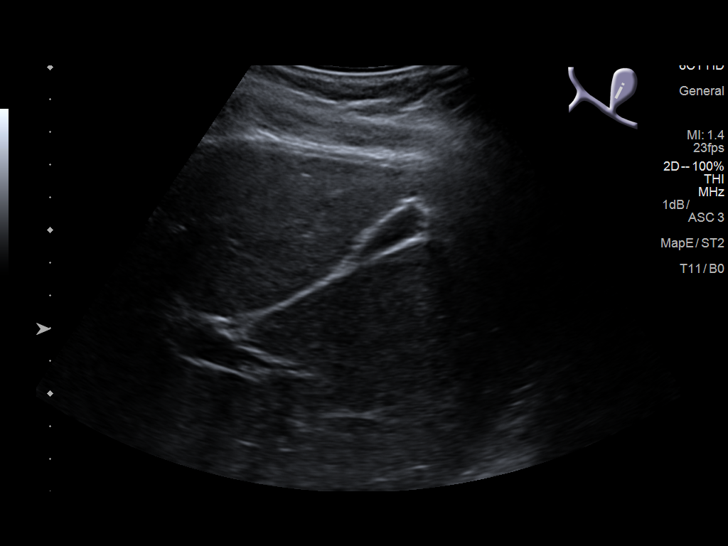
[im 5/26]
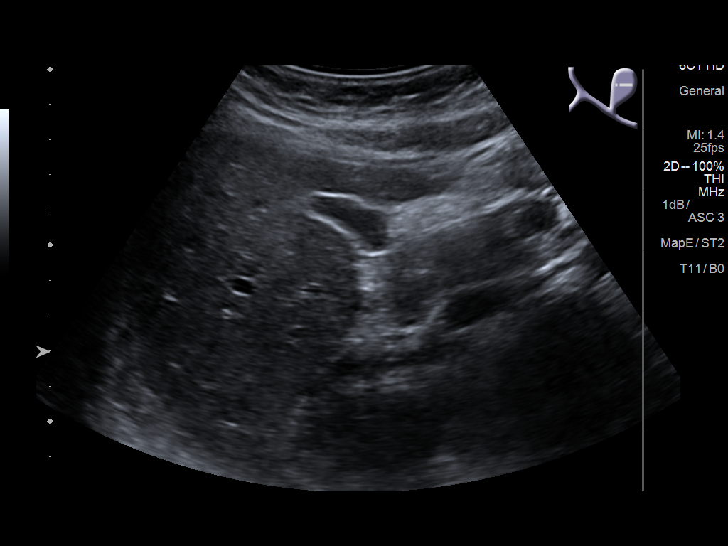
[im 7/26]
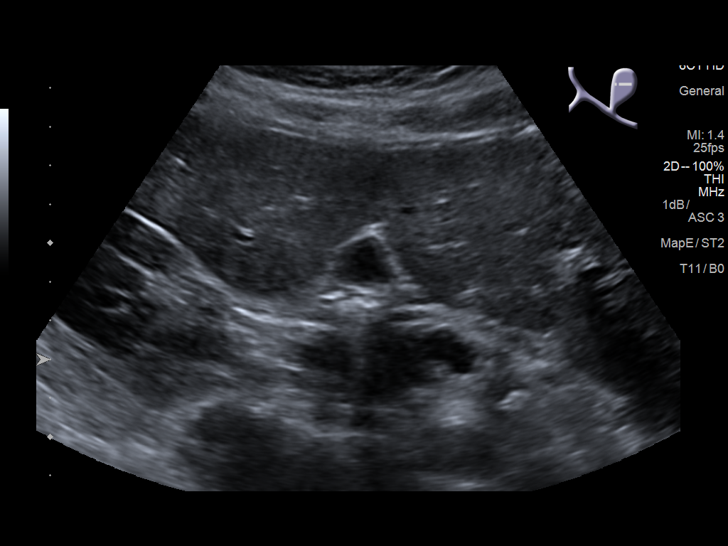
[im 9/26]
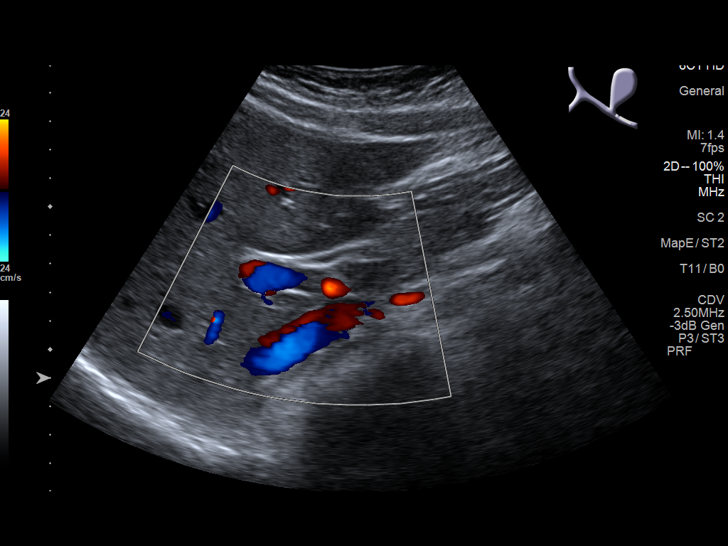
[im 10/26]
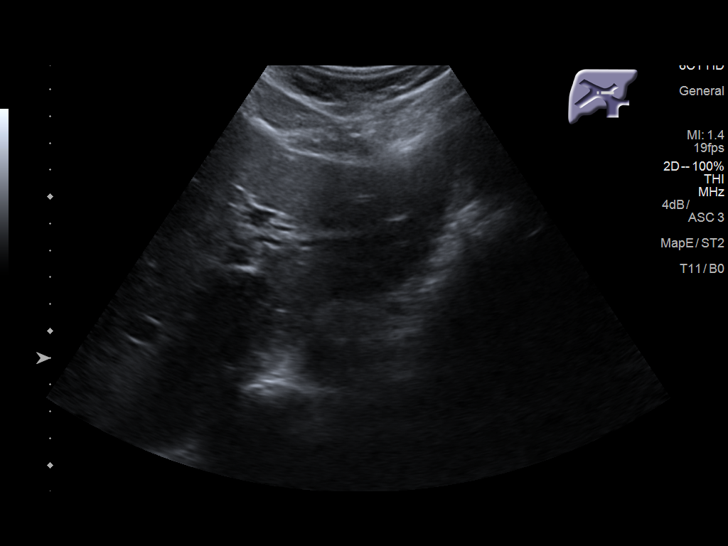
[im 12/26]
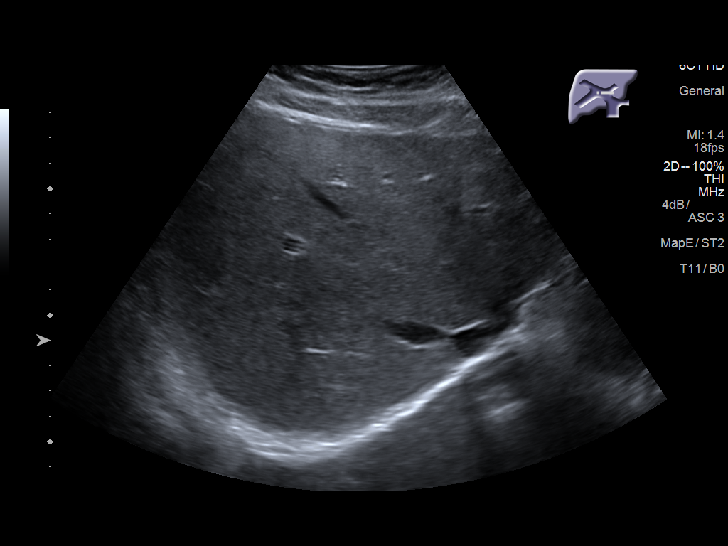
[im 14/26]
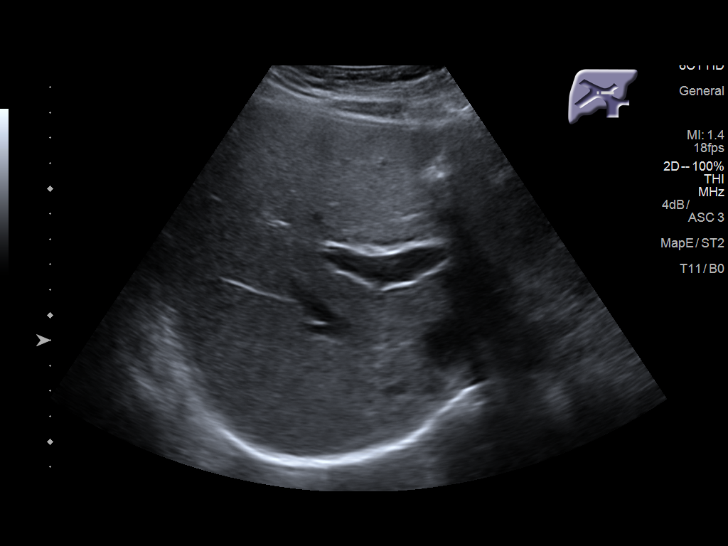
[im 16/26]
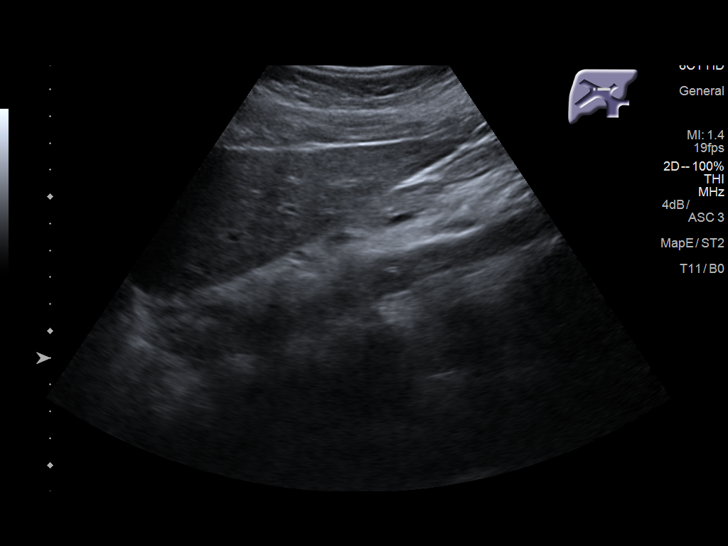
[im 17/26]
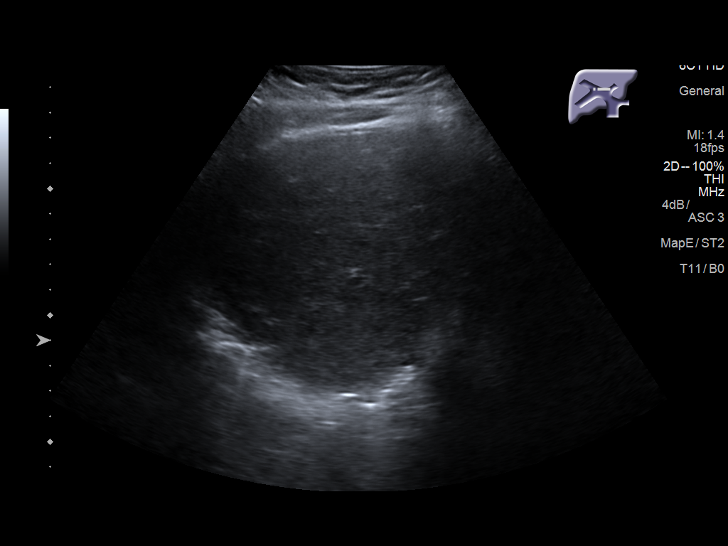
[im 19/26]
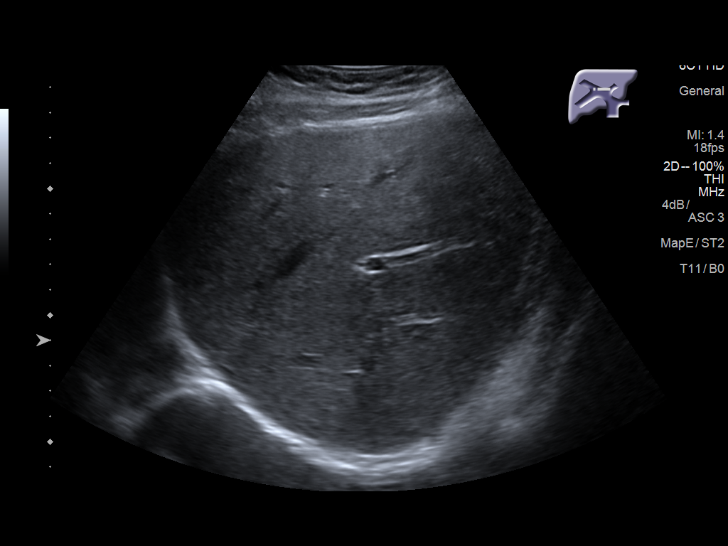
[im 21/26]
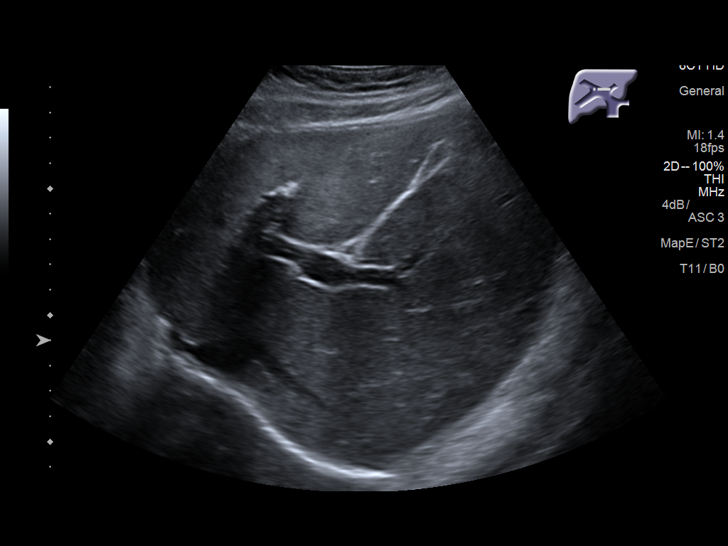
[im 23/26]
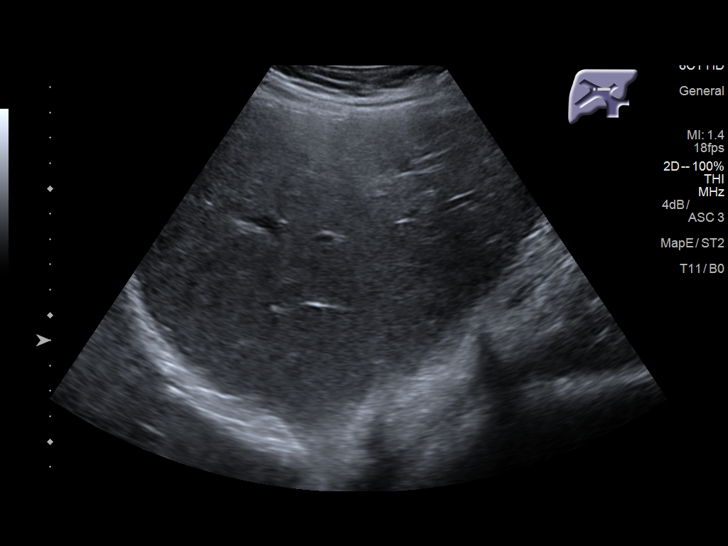
[im 26/26]
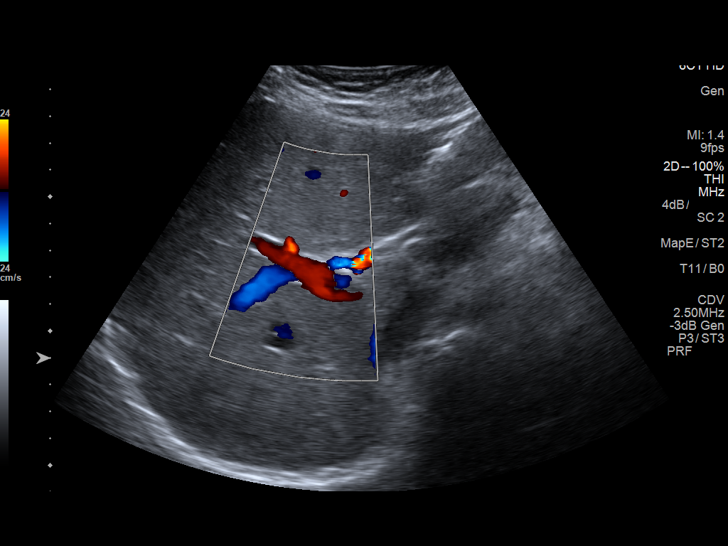

[14 of 25 positions shown; findings below may reference images not displayed]

FINDINGS: Gallbladder:

Contracted, patient was not NPO prior to the exam. Normal
gallbladder wall thickness for degree of distension. No gallstones
or pericholecystic inflammation. No sonographic Murphy sign noted by
sonographer.

Common bile duct:

Diameter: 3 mm, normal.

Liver:

No focal lesion identified. Within normal limits in parenchymal
echogenicity. Portal vein is patent on color Doppler imaging with
normal direction of blood flow towards the liver.
IMPRESSION: 1. Contracted gallbladder likely due to recent p.o. intake. No
gallstones or sonographic findings of acute cholecystitis.
2. Normal sonographic appearance of the liver and biliary tree.

## 2022-10-26 ENCOUNTER — Inpatient Hospital Stay
Admit: 2022-10-26 | Discharge: 2022-10-26 | Disposition: A | Discharge status: Home / Self Care | Payer: PRIVATE HEALTH INSURANCE | Attending: Student in an Organized Health Care Education/Training Program

## 2022-10-26 ENCOUNTER — Emergency Department: Admit: 2022-10-26 | Payer: PRIVATE HEALTH INSURANCE

## 2022-10-26 DIAGNOSIS — S60222A Contusion of left hand, initial encounter: Secondary | ICD-10-CM

## 2022-10-26 MED ORDER — ACETAMINOPHEN 500 MG PO TABS
500 | ORAL | Status: AC
Start: 2022-10-26 — End: 2022-10-26
  Administered 2022-10-26: 23:00:00 1000 mg via ORAL

## 2022-10-26 MED ORDER — ACETAMINOPHEN 500 MG PO TABS
500 | ORAL_TABLET | ORAL | 0 refills | Status: AC | PRN
Start: 2022-10-26 — End: 2022-11-25

## 2022-10-26 MED ORDER — METHOCARBAMOL 750 MG PO TABS
750 | ORAL_TABLET | ORAL | 0 refills | Status: AC | PRN
Start: 2022-10-26 — End: ?

## 2022-10-26 MED ORDER — IBUPROFEN 800 MG PO TABS
800 | ORAL_TABLET | Freq: Three times a day (TID) | ORAL | 0 refills | Status: AC | PRN
Start: 2022-10-26 — End: 2022-11-25

## 2022-10-26 MED ORDER — BACITRACIN 500 UNIT/GM EX OINT
500 | CUTANEOUS | Status: AC
Start: 2022-10-26 — End: 2022-10-26
  Administered 2022-10-26: 23:00:00 1 via TOPICAL

## 2022-10-26 MED FILL — BACITRACIN 500 UNIT/GM EX OINT: 500 UNIT/GM | CUTANEOUS | Qty: 2

## 2022-10-26 MED FILL — ACETAMINOPHEN EXTRA STRENGTH 500 MG PO TABS: 500 MG | ORAL | Qty: 2

## 2022-10-26 NOTE — Discharge Instructions (Signed)
As discussed, your workup today is negative for any emergent findings. Follow up with your PCP for further management. Return to the ER if you experience severe or worsening symptoms.
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
## 2022-10-26 NOTE — ED Notes (Signed)
Pt not assessed or treated by this RN.
